# Patient Record
Sex: Male | Born: 1980 | Hispanic: No | Smoking: Never smoker
Health system: Southern US, Community
[De-identification: ages and names within clinical notes are randomized; demographics above are authoritative.]

## PROBLEM LIST (undated history)

## (undated) HISTORY — PX: HERNIA REPAIR: SHX51

---

## 2015-01-07 ENCOUNTER — Emergency Department (HOSPITAL_BASED_OUTPATIENT_CLINIC_OR_DEPARTMENT_OTHER)
Admission: EM | Admit: 2015-01-07 | Discharge: 2015-01-07 | Disposition: A | Discharge status: Home / Self Care | Payer: No Typology Code available for payment source | Attending: Emergency Medicine | Admitting: Emergency Medicine

## 2015-01-07 ENCOUNTER — Encounter (HOSPITAL_BASED_OUTPATIENT_CLINIC_OR_DEPARTMENT_OTHER): Payer: Self-pay | Admitting: Emergency Medicine

## 2015-01-07 DIAGNOSIS — Y998 Other external cause status: Secondary | ICD-10-CM | POA: Insufficient documentation

## 2015-01-07 DIAGNOSIS — Y92410 Unspecified street and highway as the place of occurrence of the external cause: Secondary | ICD-10-CM | POA: Diagnosis not present

## 2015-01-07 DIAGNOSIS — S199XXA Unspecified injury of neck, initial encounter: Secondary | ICD-10-CM | POA: Diagnosis present

## 2015-01-07 DIAGNOSIS — S161XXA Strain of muscle, fascia and tendon at neck level, initial encounter: Secondary | ICD-10-CM | POA: Insufficient documentation

## 2015-01-07 DIAGNOSIS — Y9389 Activity, other specified: Secondary | ICD-10-CM | POA: Insufficient documentation
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
## 2015-01-07 NOTE — ED Notes (Signed)
Patient preparing for discharge. 
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
## 2015-01-07 NOTE — ED Notes (Signed)
HPD at bedside 
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
## 2016-01-27 ENCOUNTER — Emergency Department (HOSPITAL_COMMUNITY): Payer: Self-pay

## 2016-01-27 ENCOUNTER — Emergency Department (HOSPITAL_COMMUNITY)
Admission: EM | Admit: 2016-01-27 | Discharge: 2016-01-27 | Disposition: A | Discharge status: Home / Self Care | Payer: Self-pay | Attending: Emergency Medicine | Admitting: Emergency Medicine

## 2016-01-27 ENCOUNTER — Encounter (HOSPITAL_COMMUNITY): Payer: Self-pay

## 2016-01-27 DIAGNOSIS — R0600 Dyspnea, unspecified: Secondary | ICD-10-CM | POA: Insufficient documentation

## 2016-01-27 DIAGNOSIS — Z79891 Long term (current) use of opiate analgesic: Secondary | ICD-10-CM | POA: Insufficient documentation

## 2016-01-27 LAB — CBC WITH DIFFERENTIAL/PLATELET
BASOS ABS: 0 10*3/uL (ref 0.0–0.1)
BASOS PCT: 0 %
EOS ABS: 0 10*3/uL (ref 0.0–0.7)
EOS PCT: 0 %
HCT: 46 % (ref 39.0–52.0)
Hemoglobin: 15.8 g/dL (ref 13.0–17.0)
Lymphocytes Relative: 13 %
Lymphs Abs: 1.6 10*3/uL (ref 0.7–4.0)
MCH: 28.9 pg (ref 26.0–34.0)
MCHC: 34.3 g/dL (ref 30.0–36.0)
MCV: 84.2 fL (ref 78.0–100.0)
MONO ABS: 0.4 10*3/uL (ref 0.1–1.0)
Monocytes Relative: 3 %
NEUTROS ABS: 10.5 10*3/uL — AB (ref 1.7–7.7)
Neutrophils Relative %: 84 %
PLATELETS: 331 10*3/uL (ref 150–400)
RBC: 5.46 MIL/uL (ref 4.22–5.81)
RDW: 13.2 % (ref 11.5–15.5)
WBC: 12.5 10*3/uL — ABNORMAL HIGH (ref 4.0–10.5)

## 2016-01-27 LAB — URINALYSIS, ROUTINE W REFLEX MICROSCOPIC
BILIRUBIN URINE: NEGATIVE
GLUCOSE, UA: NEGATIVE mg/dL
HGB URINE DIPSTICK: NEGATIVE
Ketones, ur: NEGATIVE mg/dL
Leukocytes, UA: NEGATIVE
Nitrite: NEGATIVE
PROTEIN: NEGATIVE mg/dL
Specific Gravity, Urine: 1.009 (ref 1.005–1.030)
pH: 7 (ref 5.0–8.0)

## 2016-01-27 LAB — BASIC METABOLIC PANEL
Anion gap: 7 (ref 5–15)
BUN: 15 mg/dL (ref 6–20)
CO2: 26 mmol/L (ref 22–32)
CREATININE: 1.1 mg/dL (ref 0.61–1.24)
Calcium: 9.6 mg/dL (ref 8.9–10.3)
Chloride: 105 mmol/L (ref 101–111)
GFR calc Af Amer: >60 mL/min (ref >60)
Glucose, Bld: 137 mg/dL — ABNORMAL HIGH (ref 65–99)
Potassium: 3.4 mmol/L — ABNORMAL LOW (ref 3.5–5.1)
SODIUM: 138 mmol/L (ref 135–145)

## 2016-01-27 LAB — BRAIN NATRIURETIC PEPTIDE: B NATRIURETIC PEPTIDE 5: 10.3 pg/mL (ref 0.0–100.0)

## 2016-01-27 LAB — D-DIMER, QUANTITATIVE (NOT AT ARMC)

## 2016-01-27 MED ORDER — SODIUM CHLORIDE 0.9 % IV SOLN: INTRAVENOUS | Status: DC
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
## 2016-01-27 NOTE — Progress Notes (Signed)
CM spoke [place].  CM discussed and provided written information to assist pt with determining choice for uninsured accepting pcps, discussed the importance of pcp vs EDP services for f/u care, www.needymeds.org, www.goodrx.com, discounted pharmacies and other [place] resources such as [facility] , [facility], affordable care act, financial assistance, uninsured dental services, [place] med assist, DSS and  health department  Reviewed resources for [place] uninsured accepting pcps like [facility], [facility] [address], [facility], [facility], local urgent care centers, [facility], [facility], general medical clinics, [facility], [facility] plus others, medication resources, [facility] out patient [facility]ies and housing Pt voiced understanding and appreciation of resources provided   Provided [facility] contact information Pt states he had coverage, cancelled it and has just paid the premium for a new insurance and is awaiting availability to use the new coverage
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
## 2018-01-03 ENCOUNTER — Emergency Department (HOSPITAL_COMMUNITY)
Admission: EM | Admit: 2018-01-03 | Discharge: 2018-01-03 | Disposition: A | Discharge status: Home / Self Care | Payer: Self-pay | Attending: Emergency Medicine | Admitting: Emergency Medicine

## 2018-01-03 ENCOUNTER — Other Ambulatory Visit: Payer: Self-pay

## 2018-01-03 ENCOUNTER — Encounter (HOSPITAL_COMMUNITY): Payer: Self-pay | Admitting: *Deleted

## 2018-01-03 ENCOUNTER — Emergency Department (HOSPITAL_COMMUNITY): Payer: Self-pay

## 2018-01-03 DIAGNOSIS — R0602 Shortness of breath: Secondary | ICD-10-CM | POA: Insufficient documentation

## 2018-01-03 LAB — CBC WITH DIFFERENTIAL/PLATELET
BASOS PCT: 0 %
Basophils Absolute: 0 10*3/uL (ref 0.0–0.1)
EOS ABS: 0 10*3/uL (ref 0.0–0.7)
EOS PCT: 0 %
HEMATOCRIT: 45.3 % (ref 39.0–52.0)
Hemoglobin: 15.7 g/dL (ref 13.0–17.0)
Lymphocytes Relative: 21 %
Lymphs Abs: 2.7 10*3/uL (ref 0.7–4.0)
MCH: 29.3 pg (ref 26.0–34.0)
MCHC: 34.7 g/dL (ref 30.0–36.0)
MCV: 84.5 fL (ref 78.0–100.0)
MONO ABS: 0.3 10*3/uL (ref 0.1–1.0)
MONOS PCT: 2 %
Neutro Abs: 9.8 10*3/uL — ABNORMAL HIGH (ref 1.7–7.7)
Neutrophils Relative %: 77 %
Platelets: 312 10*3/uL (ref 150–400)
RBC: 5.36 MIL/uL (ref 4.22–5.81)
RDW: 13.5 % (ref 11.5–15.5)
WBC: 12.8 10*3/uL — ABNORMAL HIGH (ref 4.0–10.5)

## 2018-01-03 LAB — BASIC METABOLIC PANEL
Anion gap: 10 (ref 5–15)
BUN: 17 mg/dL (ref 6–20)
CALCIUM: 10.2 mg/dL (ref 8.9–10.3)
CO2: 26 mmol/L (ref 22–32)
CREATININE: 1.1 mg/dL (ref 0.61–1.24)
Chloride: 104 mmol/L (ref 98–111)
GFR calc Af Amer: >60 mL/min (ref >60)
GFR calc non Af Amer: >60 mL/min (ref >60)
GLUCOSE: 121 mg/dL — AB (ref 70–99)
Potassium: 3.8 mmol/L (ref 3.5–5.1)
Sodium: 140 mmol/L (ref 135–145)

## 2018-01-03 LAB — I-STAT TROPONIN, ED: Troponin i, poc: 0 ng/mL (ref 0.00–0.08)

## 2018-01-03 MED ORDER — LORAZEPAM 1 MG PO TABS
1.0000 mg | ORAL_TABLET | Freq: Once | ORAL | Status: AC
  Administered 2018-01-03: 1 mg via ORAL
  Filled 2018-01-03: qty 1

## 2018-01-03 MED ORDER — HYDROXYZINE HCL 25 MG PO TABS: 25.0000 mg | ORAL_TABLET | Freq: Four times a day (QID) | ORAL | 0 refills | Status: DC

## 2018-01-03 NOTE — ED Notes (Signed)
Ending O2 sat was 95% upon discharge.

## 2018-01-03 NOTE — ED Provider Notes (Signed)
[facility]-EMERGENCY DEPT Provider Note   CSN: [id number] Arrival date & time: 01/03/18  1608     History   Chief Complaint Chief Complaint  Patient presents with  . Shortness of Breath    HPI Clifford Bradford is a 37 y.o. male with no significant past medical history presents emergency department today for shortness of breath.  Patient reports that today he was outside in the heat for some time.  When he went to drive home he felt a sudden onset of shortness of breath, palpitations, dizziness, and numbness of his hands.  Patient states that he felt very anxious and also like he was going to pass out.  He states the symptoms lasted for approximately 25 minutes before resolving.  They have not recurred.  He thinks this may be due to an anxiety attack.  He reports the same thing happened to him last year.  On chart review patient with similar complaints last year with reassuring work-up including EKG, troponin, basic blood work and d-dimer.  He notes he has not followed up for this.  He notes he has been more stressed here recently.  His symptoms are not accompanied with headache, visual changes, facial droop, difficulty with speech, changes in hearing, tinnitus, vertigo, syncope, focal weakness, chest pain, cough, abdominal pain, nausea/vomiting/diarrhea, urinary symptoms.  Patient reports his symptoms are nonexertional, non-positional in nature.  He denies any pleuritic chest pain. Denies risk factors for PE including exogenous estrogen/testosterone use, recent surgery or travel, trauma, immobilization, smoking, previous blood clot, cough, hemoptysis, cancer, lower extremity pain or swelling, or family history of bleeding/clotting disorder.  Patient is a never smoker.  He denies history of MI, CVA, TIA, diabetes, hypertension, hyperlipidemia.  He denies any alcohol or drug use.  No new medications or over-the-counter medication use.  He denies any melena or hematochezia.    HPI  History reviewed. No pertinent past medical history.  There are no active problems to display for this patient.   History reviewed. No pertinent surgical history.      Home Medications    Prior to Admission medications   Medication Sig Start Date End Date Taking? Authorizing Provider  Acetaminophen (PAIN RELIEF 8 HOUR PO) Take 2 tablets by mouth every 6 (six) hours as needed (pain).    [provider]    Family History No family history on file.  Social History Social History   Tobacco Use  . Smoking status: Never Smoker  . Smokeless tobacco: Never Used  Substance Use Topics  . Alcohol use: No  . Drug use: No     Allergies   Soy allergy   Review of Systems Review of Systems  All other systems reviewed and are negative.    Physical Exam Updated Vital Signs BP (!) 155/96 (BP Location: Left Arm)   Pulse 79   Temp 98.4 F (36.9 C) (Oral)   Resp 18   SpO2 100%   Physical Exam  Constitutional: He appears well-developed and well-nourished.  HENT:  Head: Normocephalic and atraumatic.  Right Ear: External ear normal.  Left Ear: External ear normal.  Nose: Nose normal.  Mouth/Throat: Uvula is midline, oropharynx is clear and moist and mucous membranes are normal. No tonsillar exudate.  Eyes: Pupils are equal, round, and reactive to light. Right eye exhibits no discharge. Left eye exhibits no discharge. No scleral icterus.  Neck: Trachea normal. Neck supple. No spinous process tenderness present. No neck rigidity. Normal range of motion present.  Cardiovascular:  Normal rate, regular rhythm and intact distal pulses.  No murmur heard. Pulses:      Radial pulses are 2+ on the right side, and 2+ on the left side.       Dorsalis pedis pulses are 2+ on the right side, and 2+ on the left side.       Posterior tibial pulses are 2+ on the right side, and 2+ on the left side.  No lower extremity swelling or edema. Calves symmetric in size bilaterally.   Pulmonary/Chest: Effort normal and breath sounds normal. He exhibits no tenderness.  Patient satting at 100% on room air. No increased work of breathing. No accessory muscle use. Patient is sitting upright, speaking in full sentences without difficulty   Abdominal: Soft. Bowel sounds are normal. There is no tenderness. There is no rebound and no guarding.  Musculoskeletal: He exhibits no edema.  Lymphadenopathy:    He has no cervical adenopathy.  Neurological: He is alert.  Mental Status:  Alert, oriented, thought content appropriate, able to give a coherent history. Speech fluent without evidence of aphasia. Able to follow 2 step commands without difficulty.  Cranial Nerves:  II:  Peripheral visual fields grossly normal, pupils equal, round, reactive to light III,IV, VI: ptosis not present, extra-ocular motions intact bilaterally  V,VII: smile symmetric, eyebrows raise symmetric, facial light touch sensation equal VIII: hearing grossly normal to voice  X: uvula elevates symmetrically  XI: bilateral shoulder shrug symmetric and strong XII: midline tongue extension without fassiculations Motor:  Normal tone. 5/5 in upper and lower extremities bilaterally including strong and equal grip strength and dorsiflexion/plantar flexion Sensory: Sensation intact to light touch in all extremities. Negative Romberg.  Deep Tendon Reflexes: 2+ and symmetric in the biceps and patella Cerebellar: normal finger-to-nose with bilateral upper extremities. Normal heel-to -shin balance bilaterally of the lower extremity. No pronator drift.  Gait: normal gait and balance CV: distal pulses palpable throughout   Skin: Skin is warm and dry. No rash noted. He is not diaphoretic.  Psychiatric: He has a normal mood and affect.  Nursing note and vitals reviewed.    ED Treatments / Results  Labs (all labs ordered are listed, but only abnormal results are displayed) Labs Reviewed  BASIC METABOLIC PANEL -  Abnormal; Notable for the following components:      Result Value   Glucose, Bld 121 (*)    All other components within normal limits  CBC WITH DIFFERENTIAL/PLATELET - Abnormal; Notable for the following components:   WBC 12.8 (*)    Neutro Abs 9.8 (*)    All other components within normal limits  I-STAT TROPONIN, ED    EKG EKG Interpretation  Date/Time:  Tuesday January 03 2018 17:30:55 EDT Ventricular Rate:  76 PR Interval:    QRS Duration: 101 QT Interval:  361 QTC Calculation: 406 R Axis:   24 Text Interpretation:  Sinus rhythm ST elev, probable normal early repol pattern similar to prior 8/17 Confirmed by Meridee Score [phone]) on 01/03/2018 5:34:55 PM   Radiology Dg Chest 2 View  Result Date: 01/03/2018 CLINICAL DATA:  Pt states he feels anxious, shortness of breath, and bilateral hand tingling. Pt denies chest pain. No diabetes, family hx HTN EXAM: CHEST - 2 VIEW COMPARISON:  01/27/2016 FINDINGS: Mild enlargement of the cardiopericardial silhouette without edema. Low lung volumes are present, causing crowding of the pulmonary vasculature. The lungs appear clear. No pleural effusion. Mild lower thoracic spondylosis. IMPRESSION: 1. Mild enlargement of the cardiopericardial silhouette, without  edema. 2. Low lung volumes. Electronically Signed   By: Gaylyn RongWalter  Liebkemann M.D.   On: 01/03/2018 19:59    Procedures Procedures (including critical care time)  Medications Ordered in ED Medications  LORazepam (ATIVAN) tablet 1 mg (1 mg Oral Given 01/03/18 1901)     Initial Impression / Assessment and Plan / ED Course  I have reviewed the triage vital signs and the nursing notes.  Pertinent labs & imaging results that were available during my care of the patient were reviewed by me and considered in my medical decision making (see chart for details).     37 y.o. male with no significant past medical history presents for episode of shortness of breath, palpitations, dizziness,  numbness of his hands and anxiety that lasts for approximately 25 minutes earlier today.  He denies any associated focal neurologic symptoms or chest pain.  He had similar symptoms last year and had a reassuring work-up.  He has not followed up with PCP for this.  No new medications.  No melena or hematochezia.  Denies syncope.  Exam is reassuring as above.  No exophthalmos.  No diaphoresis.  Heart is regular rate and rhythm.  Lungs are clear to auscultation bilaterally.  Abdomen soft and nontender.  He has normal neurologic exam.  Vital signs are reassuring without fever, tachycardia, tachypnea, hypoxia or hypotension.  Lab work, EKG and chest x-ray ordered.  Patient is without tachycardia, hypoxia.  Patient is Wells and Perc negative.  Do not suspect PE.  There is no significant change of patient's EKG and troponin is negative.  Low risk per heart score.  I have low suspicion for ACS at this time.  Patient is without anemia.  His electrolytes are within normal limits.  There is no evidence of DKA.  No acute kidney injury.  Chest x-ray does show mild enlargement of his cardiopericardial silhouette but without edema.  Discussed this with the patient he is to follow-up with his PCP.  Patient has been without tachycardia or hypoxia in the department. He reports his symptoms have not reoccurred. He reports improvement of symptoms with above tx. The evaluation does not show pathology that would require ongoing emergent intervention or inpatient treatment. I advised the patient to follow-up with PCP this week. I advised the patient to return to the emergency department with new or worsening symptoms or new concerns. Specific return precautions discussed. The patient verbalized understanding and agreement with plan. All questions answered. No further questions at this time. The patient is hemodynamically stable, mentating appropriately and appears safe for discharge.  Final Clinical Impressions(s) / ED Diagnoses    Final diagnoses:  Shortness of breath    ED Discharge Orders        Ordered    hydrOXYzine (ATARAX/VISTARIL) 25 MG tablet  Every 6 hours     01/03/18 2133       Princella PellegriniMaczis, Caelan Atchley M, PA-C 01/06/18 0916    Terrilee FilesButler, Axton Cihlar C, MD 01/06/18 [phone]

## 2018-01-03 NOTE — ED Triage Notes (Signed)
Pt states he feels anxious, shortness of breath, and bilateral hand tingling. Pt denies chest pain.

## 2018-01-03 NOTE — Discharge Instructions (Addendum)
Please read and follow all provided instructions.  Your diagnoses today include:  1. Shortness of breath     Tests performed today include: 1. Vital signs. See below for your results today.  2. Chest xray 3. Troponin -there is an enzyme that is released from her heart during times of stress.  This was normal. 4. EKG -this did not show any new changes 5. Blood levels and electrolytes -this did not show any concerning changes  Home care instructions:  Follow any educational materials contained in this packet.  Follow-up instructions: Please follow-up with your primary care provider this week for further evaluation of symptoms and treatment   Return instructions:   Please return to the Emergency Department if you do not get better, if you get worse, or new symptoms OR  Get help right away if:  Your shortness of breath gets worse.  You cough up blood  You have shortness of breath when you are resting.  You feel light-headed or you faint.  You cough up blood.  You have pain with breathing.  You have pain in your chest, arms, shoulders, or abdomen.  You have a fever.   You cannot walk up stairs or exercise the way that you normally do.   Change in mental status (confusion or lethargy)   New numbness or weakness     Please return if you have any other emergent concerns.  Additional Information:  Your vital signs today were: BP (!) 152/75    Pulse 71    Temp 98.4 F (36.9 C) (Oral)    Resp 18    SpO2 100%  If your blood pressure (BP) was elevated above 135/85 this visit, please have this repeated by your doctor within one month.

## 2018-04-18 ENCOUNTER — Emergency Department (HOSPITAL_COMMUNITY): Payer: Self-pay

## 2018-04-18 ENCOUNTER — Encounter (HOSPITAL_COMMUNITY): Payer: Self-pay

## 2018-04-18 ENCOUNTER — Emergency Department (HOSPITAL_COMMUNITY)
Admission: EM | Admit: 2018-04-18 | Discharge: 2018-04-19 | Disposition: A | Discharge status: Home / Self Care | Payer: Self-pay | Attending: Emergency Medicine | Admitting: Emergency Medicine

## 2018-04-18 DIAGNOSIS — E876 Hypokalemia: Secondary | ICD-10-CM | POA: Insufficient documentation

## 2018-04-18 DIAGNOSIS — H538 Other visual disturbances: Secondary | ICD-10-CM | POA: Insufficient documentation

## 2018-04-18 DIAGNOSIS — Z733 Stress, not elsewhere classified: Secondary | ICD-10-CM | POA: Insufficient documentation

## 2018-04-18 DIAGNOSIS — F419 Anxiety disorder, unspecified: Secondary | ICD-10-CM | POA: Insufficient documentation

## 2018-04-18 DIAGNOSIS — R202 Paresthesia of skin: Secondary | ICD-10-CM | POA: Insufficient documentation

## 2018-04-18 LAB — CBC
HEMATOCRIT: 44.9 % (ref 39.0–52.0)
HEMOGLOBIN: 14.9 g/dL (ref 13.0–17.0)
MCH: 28.4 pg (ref 26.0–34.0)
MCHC: 33.2 g/dL (ref 30.0–36.0)
MCV: 85.5 fL (ref 80.0–100.0)
Platelets: 321 10*3/uL (ref 150–400)
RBC: 5.25 MIL/uL (ref 4.22–5.81)
RDW: 13.2 % (ref 11.5–15.5)
WBC: 9.5 10*3/uL (ref 4.0–10.5)
nRBC: 0 % (ref 0.0–0.2)

## 2018-04-18 LAB — BASIC METABOLIC PANEL
ANION GAP: 9 (ref 5–15)
BUN: 17 mg/dL (ref 6–20)
CHLORIDE: 105 mmol/L (ref 98–111)
CO2: 26 mmol/L (ref 22–32)
CREATININE: 1.27 mg/dL — AB (ref 0.61–1.24)
Calcium: 10.1 mg/dL (ref 8.9–10.3)
GFR calc non Af Amer: >60 mL/min (ref >60)
Glucose, Bld: 106 mg/dL — ABNORMAL HIGH (ref 70–99)
Potassium: 3.1 mmol/L — ABNORMAL LOW (ref 3.5–5.1)
Sodium: 140 mmol/L (ref 135–145)

## 2018-04-18 MED ORDER — LORAZEPAM 2 MG/ML IJ SOLN
1.0000 mg | Freq: Once | INTRAMUSCULAR | Status: AC
  Administered 2018-04-18: 1 mg via INTRAVENOUS
  Filled 2018-04-18: qty 1

## 2018-04-18 MED ORDER — GADOBUTROL 1 MMOL/ML IV SOLN
10.0000 mL | Freq: Once | INTRAVENOUS | Status: AC | PRN
  Administered 2018-04-18: 10 mL via INTRAVENOUS

## 2018-04-18 MED ORDER — LORAZEPAM 2 MG/ML IJ SOLN
2.0000 mg | Freq: Once | INTRAMUSCULAR | Status: AC
  Administered 2018-04-18: 2 mg via INTRAVENOUS
  Filled 2018-04-18: qty 1

## 2018-04-18 NOTE — ED Provider Notes (Signed)
Clifford [facility] Provider Note   CSN: [id number] Arrival date & time: 04/18/18  1532     History   Chief Complaint Chief Complaint  Patient presents with  . Numbness    left side    HPI Clifford Bradford is a 37 y.o. male here presenting with numbness, weakness.  Patient states that he has occasional left-sided numbness for the last 2 years.  Patient states that the numbness comes on when he is under a lot of stress.  He states that today he started having some abdominal pain and then he proceeded having some left-sided numbness in his face as well as left side of his neck and arm and leg.  He states that his vision has been more blurry but denies double vision. Denies any headaches.  Patient has no previous diagnosis of multiple sclerosis or strokes in the past.  Patient states that the abdominal pain has resolved since he is in the ER and he has no vomiting or fevers.  States that he is under a lot of stress recently.  The history is provided by the patient.    History reviewed. No pertinent past medical history.  There are no active problems to display for this patient.   History reviewed. No pertinent surgical history.      Home Medications    Prior to Admission medications   Medication Sig Start Date End Date Taking? Authorizing Provider  acetaminophen (TYLENOL) 500 MG tablet Take 500 mg by mouth every 6 (six) hours as needed for moderate pain.   Yes [provider]  hydrOXYzine (ATARAX/VISTARIL) 25 MG tablet Take 1 tablet (25 mg total) by mouth every 6 (six) hours. Patient not taking: Reported on 04/18/2018 01/03/18   Maczis, Elmer Sow, PA-C    Family History History reviewed. No pertinent family history.  Social History Social History   Tobacco Use  . Smoking status: Never Smoker  . Smokeless tobacco: Never Used  Substance Use Topics  . Alcohol use: No  . Drug use: No     Allergies   Soy allergy   Review of  Systems Review of Systems  Neurological: Positive for numbness and headaches.  All other systems reviewed and are negative.    Physical Exam Updated Vital Signs BP (!) 154/87   Pulse 79   Temp 98.7 F (37.1 C) (Oral)   Resp 16   Ht 6\' 1"  (1.854 m)   Wt 108.9 kg   SpO2 100%   BMI 31.66 kg/m   Physical Exam  Constitutional: He is oriented to person, place, and time. He appears well-developed and well-nourished.  Slightly anxious   HENT:  Head: Normocephalic.  Mouth/Throat: Oropharynx is clear and moist.  Eyes: Pupils are equal, round, and reactive to light. Conjunctivae and EOM are normal.  No diplopia   Neck: Normal range of motion. Neck supple.  Cardiovascular: Normal rate, regular rhythm and normal heart sounds.  Pulmonary/Chest: Effort normal and breath sounds normal. No stridor. No respiratory distress.  Abdominal: Soft. Bowel sounds are normal. He exhibits no distension. There is no tenderness.  Musculoskeletal: Normal range of motion.  Neurological: He is alert and oriented to person, place, and time.  CN 2- 12 intact. Slightly dec sensation L arm and L leg, nl strength throughout. Nl gait. Nl finger to nose bilaterally.   Skin: Skin is warm. Capillary refill takes less than 2 seconds.  Psychiatric: He has a normal mood and affect.  Nursing note and vitals reviewed.  ED Treatments / Results  Labs (all labs ordered are listed, but only abnormal results are displayed) Labs Reviewed  BASIC METABOLIC PANEL - Abnormal; Notable for the following components:      Result Value   Potassium 3.1 (*)    Glucose, Bld 106 (*)    Creatinine, Ser 1.27 (*)    All other components within normal limits  CBC  URINALYSIS, ROUTINE W REFLEX MICROSCOPIC  CBG MONITORING, ED    EKG EKG Interpretation  Date/Time:  Tuesday April 18 2018 17:03:45 EDT Ventricular Rate:  74 PR Interval:    QRS Duration: 97 QT Interval:  358 QTC Calculation: 398 R Axis:   30 Text  Interpretation:  Sinus rhythm RSR' in V1 or V2, right VCD or RVH No significant change since last tracing Confirmed by Richardean Canal [phone]) on 04/18/2018 9:32:47 PM   Radiology No results found.  Procedures Procedures (including critical care time)  Medications Ordered in ED Medications  LORazepam (ATIVAN) injection 1 mg (1 mg Intravenous Given 04/18/18 2156)  LORazepam (ATIVAN) injection 2 mg (2 mg Intravenous Given 04/18/18 2250)     Initial Impression / Assessment and Plan / ED Course  I have reviewed the triage vital signs and the nursing notes.  Pertinent labs & imaging results that were available during my care of the patient were reviewed by me and considered in my medical decision making (see chart for details).    Clifford Bradford is a 36 y.o. male here with occasional blurry vision, L arm and leg numbness. He is under a lot of stress. Slightly dec sensation L arm and leg. Will get MRI brain/cervical spine to r/o MS. Will get basic labs.   11:45 PM MRI brain/cervical spine pending. Signed out to Dr. Denton Lank to follow up results. If MRI normal, will dc home.   Final Clinical Impressions(s) / ED Diagnoses   Final diagnoses:  None    ED Discharge Orders    None       Charlynne Pander, MD 04/18/18 [phone]

## 2018-04-18 NOTE — ED Triage Notes (Signed)
Patient arrived via POV. -C/o left side numbness starting from top of head, c/o of fingers tingling that comes in waves. Back of neck is tight.  Numbness started today about 1330.  Tingles and tightness on and off x2years

## 2018-04-19 MED ORDER — POTASSIUM CHLORIDE CRYS ER 20 MEQ PO TBCR: 20.0000 meq | EXTENDED_RELEASE_TABLET | Freq: Every day | ORAL | 0 refills | Status: DC

## 2018-04-19 MED ORDER — POTASSIUM CHLORIDE CRYS ER 20 MEQ PO TBCR
40.0000 meq | EXTENDED_RELEASE_TABLET | Freq: Once | ORAL | Status: AC
  Administered 2018-04-19: 40 meq via ORAL
  Filled 2018-04-19: qty 2

## 2018-04-19 NOTE — ED Provider Notes (Signed)
Signed out by Dr Silverio Lay at 12 mn that if/when mri neg for acute process to d/c to home.  Mri neg acute.  Labs reviewed - k sl low, kcl po.  Pt informed of mr result.   Pt appears stable for d/c.      Cathren Laine, MD 04/19/18 [phone]

## 2018-04-19 NOTE — Discharge Instructions (Addendum)
It was our pleasure to provide your ER care today - we hope that you feel better.  Follow up with primary care doctor/neurologist in the next 1-2 weeks for recheck - call office to arrange appointment.  From today's lab tests, your potassium level is mildly low (3.1) - eat plenty of fruits and vegetables, take potassium supplement as prescribed, and follow up with primary care doctor.

## 2019-08-30 ENCOUNTER — Other Ambulatory Visit: Payer: Self-pay

## 2019-08-30 ENCOUNTER — Ambulatory Visit (INDEPENDENT_AMBULATORY_CARE_PROVIDER_SITE_OTHER): Payer: No Typology Code available for payment source | Admitting: Family Medicine

## 2019-08-30 ENCOUNTER — Encounter: Payer: Self-pay | Admitting: Family Medicine

## 2019-08-30 VITALS — BP 138/76 | HR 71 | Temp 98.0°F | Ht 73.0 in | Wt 239.6 lb

## 2019-08-30 DIAGNOSIS — Z13228 Encounter for screening for other metabolic disorders: Secondary | ICD-10-CM | POA: Diagnosis not present

## 2019-08-30 DIAGNOSIS — Z130 Encounter for screening for diseases of the blood and blood-forming organs and certain disorders involving the immune mechanism: Secondary | ICD-10-CM

## 2019-08-30 DIAGNOSIS — Z13220 Encounter for screening for lipoid disorders: Secondary | ICD-10-CM | POA: Diagnosis not present

## 2019-08-30 DIAGNOSIS — Z0000 Encounter for general adult medical examination without abnormal findings: Secondary | ICD-10-CM | POA: Diagnosis not present

## 2019-08-30 DIAGNOSIS — Z1329 Encounter for screening for other suspected endocrine disorder: Secondary | ICD-10-CM | POA: Diagnosis not present

## 2019-08-30 NOTE — Patient Instructions (Addendum)
Use flonase (fluticasone) or nasacort (triamcinolone) nasal sprays 1-2 sprays every night for your nasal drainage  Preventive Care 69-39 Years Old, Male Preventive care refers to lifestyle choices and visits with your health care provider that can promote health and wellness. This includes:  A yearly physical exam. This is also called an annual well check.  Regular dental and eye exams.  Immunizations.  Screening for certain conditions.  Healthy lifestyle choices, such as eating a healthy diet, getting regular exercise, not using drugs or products that contain nicotine and tobacco, and limiting alcohol use. What can I expect for my preventive care visit? Physical exam Your health care provider will check:  Height and weight. These may be used to calculate body mass index (BMI), which is a measurement that tells if you are at a healthy weight.  Heart rate and blood pressure.  Your skin for abnormal spots. Counseling Your health care provider may ask you questions about:  Alcohol, tobacco, and drug use.  Emotional well-being.  Home and relationship well-being.  Sexual activity.  Eating habits.  Work and work Statistician. What immunizations do I need?  Influenza (flu) vaccine  This is recommended every year. Tetanus, diphtheria, and pertussis (Tdap) vaccine  You may need a Td booster every 10 years. Varicella (chickenpox) vaccine  You may need this vaccine if you have not already been vaccinated. Human papillomavirus (HPV) vaccine  If recommended by your health care provider, you may need three doses over 6 months. Measles, mumps, and rubella (MMR) vaccine  You may need at least one dose of MMR. You may also need a second dose. Meningococcal conjugate (MenACWY) vaccine  One dose is recommended if you are 50-68 years old and a Market researcher living in a residence hall, or if you have one of several medical conditions. You may also need additional  booster doses. Pneumococcal conjugate (PCV13) vaccine  You may need this if you have certain conditions and were not previously vaccinated. Pneumococcal polysaccharide (PPSV23) vaccine  You may need one or two doses if you smoke cigarettes or if you have certain conditions. Hepatitis A vaccine  You may need this if you have certain conditions or if you travel or work in places where you may be exposed to hepatitis A. Hepatitis B vaccine  You may need this if you have certain conditions or if you travel or work in places where you may be exposed to hepatitis B. Haemophilus influenzae type b (Hib) vaccine  You may need this if you have certain risk factors. You may receive vaccines as individual doses or as more than one vaccine together in one shot (combination vaccines). Talk with your health care provider about the risks and benefits of combination vaccines. What tests do I need? Blood tests  Lipid and cholesterol levels. These may be checked every 5 years starting at age 78.  Hepatitis C test.  Hepatitis B test. Screening   Diabetes screening. This is done by checking your blood sugar (glucose) after you have not eaten for a while (fasting).  Sexually transmitted disease (STD) testing. Talk with your health care provider about your test results, treatment options, and if necessary, the need for more tests. Follow these instructions at home: Eating and drinking   Eat a diet that includes fresh fruits and vegetables, whole grains, lean protein, and low-fat dairy products.  Take vitamin and mineral supplements as recommended by your health care provider.  Do not drink alcohol if your health care provider tells  you not to drink.  If you drink alcohol: ? Limit how much you have to 0-2 drinks a day. ? Be aware of how much alcohol is in your drink. In the [place] one drink equals one 12 oz bottle of beer (355 mL), one 5 oz glass of wine (148 mL), or one 1 oz glass of hard liquor  (44 mL). Lifestyle  Take daily care of your teeth and gums.  Stay active. Exercise for at least 30 minutes on 5 or more days each week.  Do not use any products that contain nicotine or tobacco, such as cigarettes, e-cigarettes, and chewing tobacco. If you need help quitting, ask your health care provider.  If you are sexually active, practice safe sex. Use a condom or other form of protection to prevent STIs (sexually transmitted infections). What's next?  Go to your health care provider once a year for a well check visit.  Ask your health care provider how often you should have your eyes and teeth checked.  Stay up to date on all vaccines. This information is not intended to replace advice given to you by your health care provider. Make sure you discuss any questions you have with your health care provider. Document Revised: 06/01/2018 Document Reviewed: 06/01/2018 Elsevier Patient Education  [employer].    If you have lab work done today you will be contacted with your lab results within the next 2 weeks.  If you have not heard from [place] then please contact us. The fastest way to get your results is to register for My Chart.   IF you received an x-ray today, you will receive an invoice from [facility]. Please contact [facility] at [phone] with questions or concerns regarding your invoice.   IF you received labwork today, you will receive an invoice from [facility]. Please contact [facility] at [phone] with questions or concerns regarding your invoice.   Our billing staff will not be able to assist you with questions regarding bills from these companies.  You will be contacted with the lab results as soon as they are available. The fastest way to get your results is to activate your My Chart account. Instructions are located on the last page of this paperwork. If you have not heard from [place] regarding the results in 2 weeks, please contact this  office.

## 2019-08-30 NOTE — Progress Notes (Signed)
3/11/20218:23 AM  Nicholas Lose March 27, 1981, 39 y.o., male [id number]  Chief Complaint  Patient presents with  . Annual Exam    has not had physical in 10 yrs    HPI:   Patient is a 39 y.o. 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 0  Number falls in past yr: 0  Injury with Fall? 0     Allergies  Allergen Reactions  . Soy Allergy Shortness Of Breath and Swelling    Prior to Admission medications   Not on File    History reviewed. No pertinent past medical history.  History reviewed. No pertinent surgical history.  Social History   Tobacco Use  . Smoking status: Never Smoker  . Smokeless tobacco: Never Used  Substance Use Topics  . Alcohol use: No    Family History  Problem Relation Age of Onset  . Healthy Mother   . Healthy Father   . Healthy Sister     Review of Systems  Constitutional: Negative for chills, diaphoresis, fever and malaise/fatigue.  HENT: Positive for congestion and hearing loss (muffled, poping). Negative for tinnitus.   Eyes: Negative for blurred vision and double vision.    Respiratory: Positive for shortness of breath (when anxious). Negative for cough.   Cardiovascular: Negative for chest pain, palpitations and leg swelling.  Gastrointestinal: Positive for heartburn. Negative for abdominal pain, nausea and vomiting.  Psychiatric/Behavioral: Negative for depression. The patient is nervous/anxious. The patient does not have insomnia.   All other systems reviewed and are negative.    OBJECTIVE:  Today's Vitals   08/30/19 0816  BP: 138/76  Pulse: 71  Temp: 98 F (36.7 C)  SpO2: 100%  Weight: 239 lb 9.6 oz (108.7 kg)  Height: 6\' 1"  (1.854 m)   Body mass index is 31.61 kg/m.   Physical Exam Vitals and nursing note reviewed.  Constitutional:      Appearance: He is well-developed.  HENT:     Head: Normocephalic and atraumatic.     Right Ear: Hearing, tympanic membrane, ear canal and external ear normal.     Left Ear: Hearing, tympanic membrane, ear canal and external ear normal.     Mouth/Throat:     Pharynx: No oropharyngeal exudate.  Eyes:     Conjunctiva/sclera: Conjunctivae normal.     Pupils: Pupils are equal, round, and reactive to light.  Neck:     Thyroid: No thyromegaly.  Cardiovascular:     Rate and Rhythm: Normal rate and regular rhythm.  Heart sounds: Normal heart sounds. No murmur. No friction rub. No gallop.   Pulmonary:     Effort: Pulmonary effort is normal.     Breath sounds: Normal breath sounds. No wheezing, rhonchi or rales.  Abdominal:     General: Bowel sounds are normal. There is no distension.     Palpations: Abdomen is soft. There is no mass.     Tenderness: There is no abdominal tenderness.  Musculoskeletal:        General: Normal range of motion.     Cervical back: Neck supple.     Right lower leg: No edema.     Left lower leg: No edema.  Lymphadenopathy:     Cervical: No cervical adenopathy.  Skin:    General: Skin is warm and dry.  Neurological:     Mental Status: He is alert and oriented to person,  place, and time.     Cranial Nerves: No cranial nerve deficit.     Coordination: Coordination normal.     Gait: Gait normal.     Deep Tendon Reflexes: Reflexes are normal and symmetric.     No results found for this or any previous visit (from the past 24 hour(s)).  No results found.   ASSESSMENT and PLAN  1. Annual physical exam Routine HCM labs ordered. HCM reviewed/discussed. Anticipatory guidance regarding healthy weight, lifestyle and choices given.   2. Screening for lipid disorders - Lipid panel  3. Screening for endocrine, metabolic and immunity disorder - Comprehensive metabolic panel  Return in about 1 year (around 08/29/2020).    Myles Lipps, MD [facility] at [facility] [address] [place], [place] [address] Ph.  [phone] Fax [phone]

## 2019-08-31 LAB — COMPREHENSIVE METABOLIC PANEL
ALT: 38 IU/L (ref 0–44)
AST: 29 IU/L (ref 0–40)
Albumin/Globulin Ratio: 1.7 (ref 1.2–2.2)
Albumin: 4.8 g/dL (ref 4.0–5.0)
Alkaline Phosphatase: 58 IU/L (ref 39–117)
BUN/Creatinine Ratio: 12 (ref 9–20)
BUN: 12 mg/dL (ref 6–20)
Bilirubin Total: 0.3 mg/dL (ref 0.0–1.2)
CO2: 25 mmol/L (ref 20–29)
Calcium: 10.1 mg/dL (ref 8.7–10.2)
Chloride: 101 mmol/L (ref 96–106)
Creatinine, Ser: 1.04 mg/dL (ref 0.76–1.27)
GFR calc Af Amer: 105 mL/min/{1.73_m2} (ref >59)
GFR calc non Af Amer: 91 mL/min/{1.73_m2} (ref >59)
Globulin, Total: 2.8 g/dL (ref 1.5–4.5)
Glucose: 97 mg/dL (ref 65–99)
Potassium: 4.4 mmol/L (ref 3.5–5.2)
Sodium: 142 mmol/L (ref 134–144)
Total Protein: 7.6 g/dL (ref 6.0–8.5)

## 2019-08-31 LAB — LIPID PANEL
Chol/HDL Ratio: 3.8 ratio (ref 0.0–5.0)
Cholesterol, Total: 237 mg/dL — ABNORMAL HIGH (ref 100–199)
HDL: 63 mg/dL (ref >39)
LDL Chol Calc (NIH): 147 mg/dL — ABNORMAL HIGH (ref 0–99)
Triglycerides: 150 mg/dL — ABNORMAL HIGH (ref 0–149)
VLDL Cholesterol Cal: 27 mg/dL (ref 5–40)

## 2020-02-19 IMAGING — MR MR CERVICAL SPINE WO/W CM
16 of 23 series · 25 of 48 positions shown · IV contrast (gadavist)
Comparison: None.

CLINICAL DATA: Initial evaluation for left-sided numbness with
tingling.

EXAM:
MRI HEAD WITHOUT AND WITH CONTRAST
MRI CERVICAL SPINE WITHOUT AND WITH CONTRAST
TECHNIQUE: Multiplanar, multiecho pulse sequences of the brain and surrounding
structures, and cervical spine, to include the craniocervical
junction and cervicothoracic junction, were obtained without and
with intravenous contrast.
CONTRAST:  10 cc of Gadavist.

[Series 4: DWI · axial · 3.0mm · 1.09mm/px · z∈[-58,+95]mm · 6 of 110 slices shown (1 of 2)]
[im 1/110]
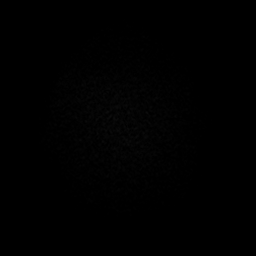
[im 22/110]
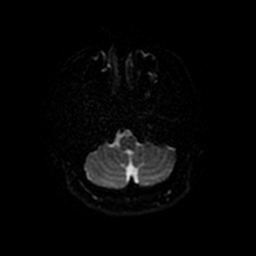
[im 44/110]
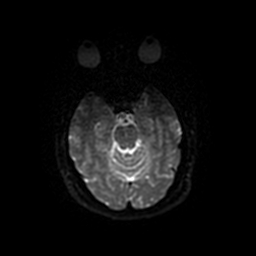
[im 66/110]
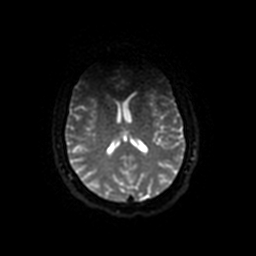
[im 88/110]
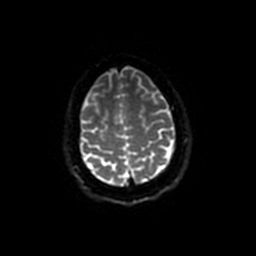
[im 110/110]
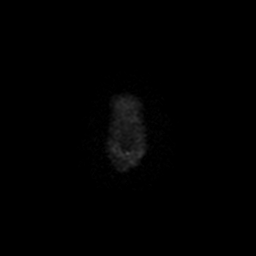

[Series 5: T1 · sagittal · 5.0mm · 0.47mm/px · 2 of 24 slices shown (1 of 3)]
[im 1/24]
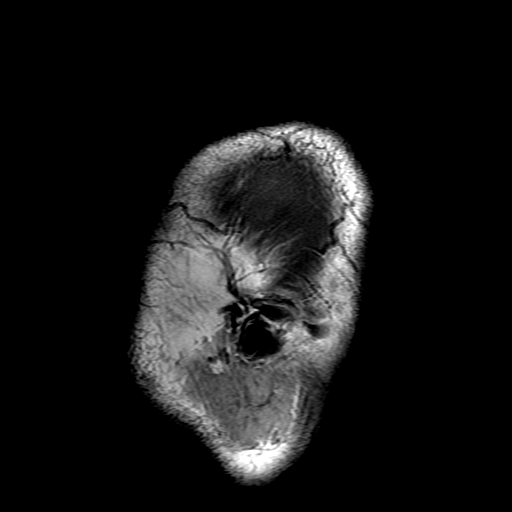
[im 24/24]
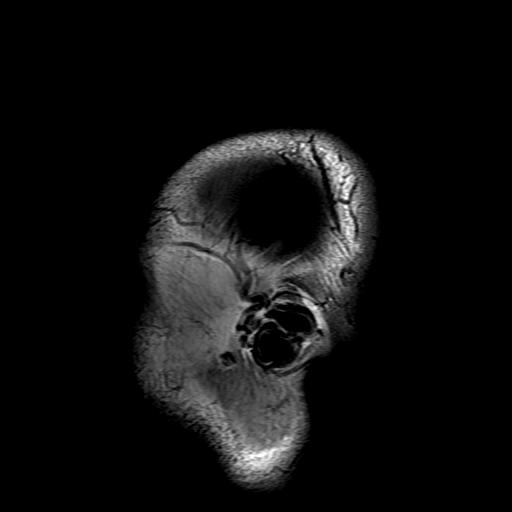

[Series 6: DWI · coronal · 4.0mm · 1.09mm/px · 3 of 70 slices shown (2 of 2)]
[im 1/70]
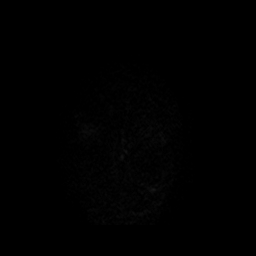
[im 35/70]
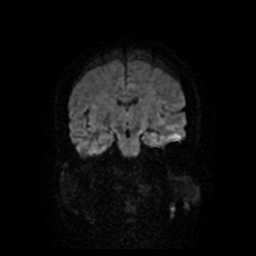
[im 70/70]
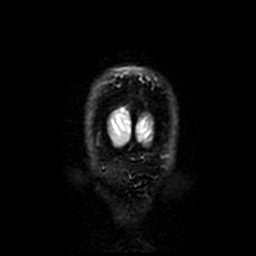

[Series 7: T2 · axial · 5.0mm · 0.43mm/px · 1 of 28 slices shown (1 of 4)]
[im 1/28]
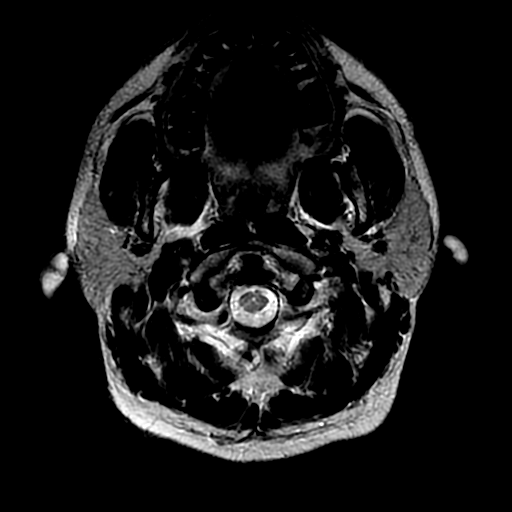

[Series 8: FLAIR · axial · 5.0mm · 0.43mm/px · 1 of 28 slices shown]
[im 1/28]
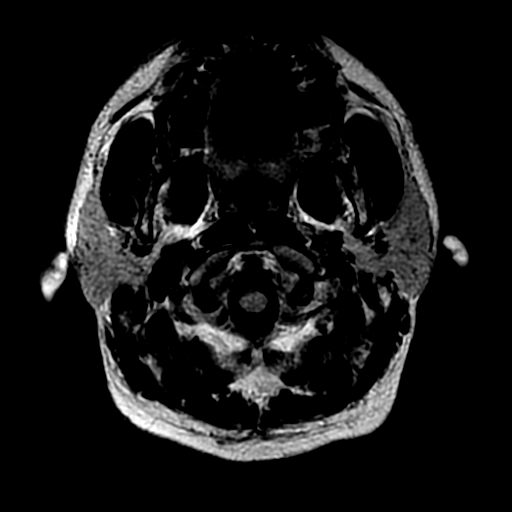

[Series 9: ax mpgr · axial · 5.0mm · 0.43mm/px · 1 of 25 slices shown]
[im 1/25]
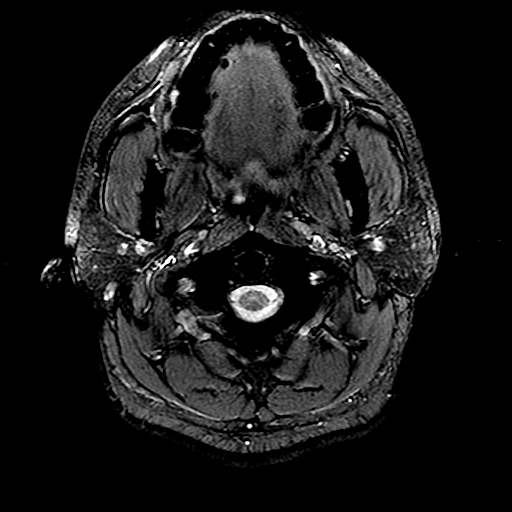

[Series 10: ax fspgr irp · axial · 3.0mm · 0.47mm/px · z∈[-50,+107]mm · 2 of 56 slices shown]
[im 1/56]
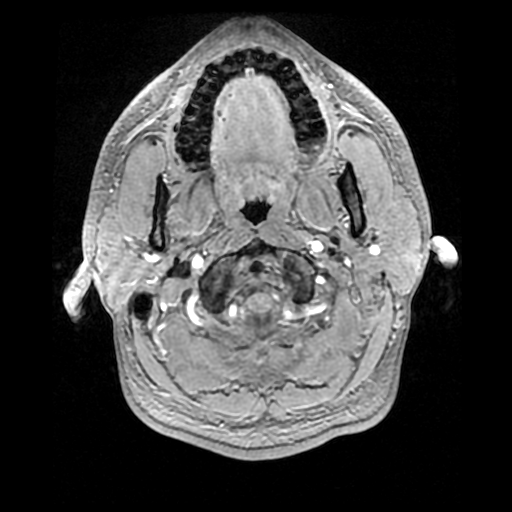
[im 56/56]
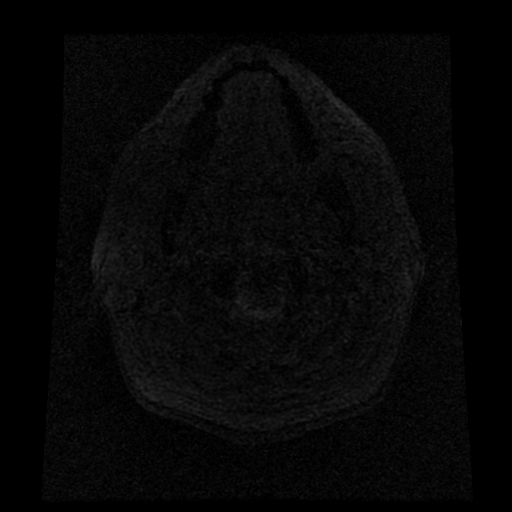

[Series 11: T2 · coronal · 5.0mm · 0.45mm/px · 1 of 29 slices shown (2 of 4)]
[im 1/29]
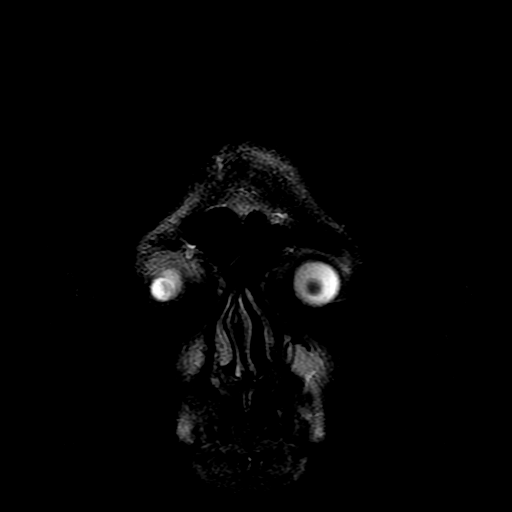

[Series 14: T1 · sagittal · 3.0mm · 0.82mm/px · 1 of 12 slices shown (2 of 3)]
[im 1/12]
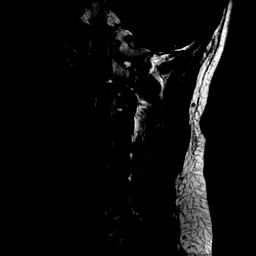

[Series 16: T2 · sagittal · 3.0mm · 0.41mm/px · 1 of 12 slices shown (3 of 4)]
[im 1/12]
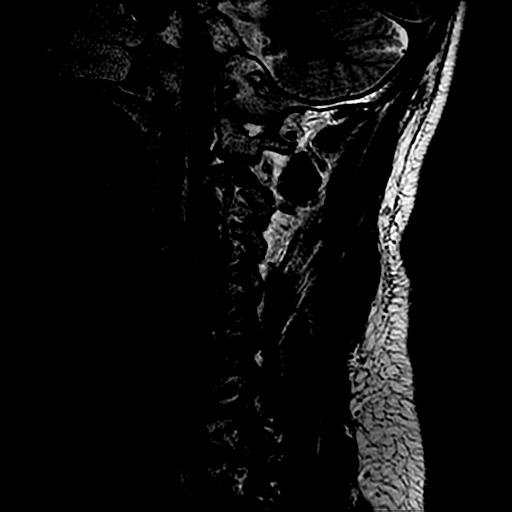

[Series 18: T2 · axial · 3.1mm · 0.35mm/px · 1 of 34 slices shown (4 of 4)]
[im 1/34]
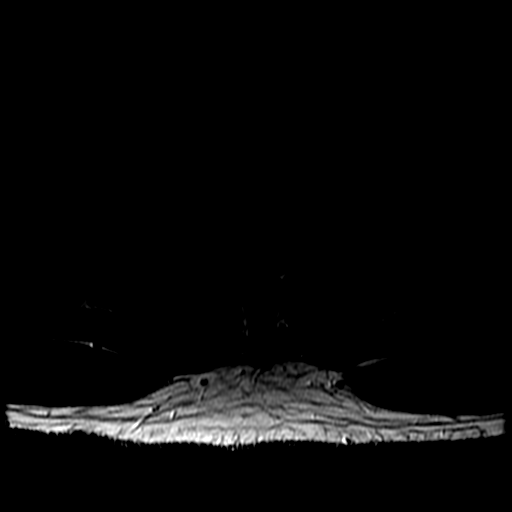

[Series 19: T1 · axial · non-contrast · 3.1mm · 0.35mm/px · 1 of 34 slices shown (3 of 3)]
[im 1/34]
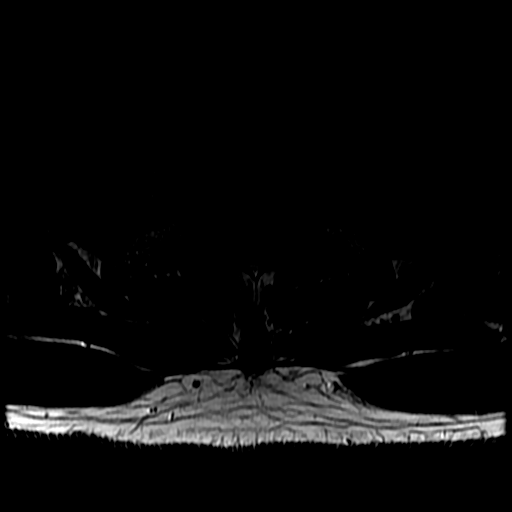

[Series 20: T1 fat-sat post-contrast · sagittal · 3.0mm · 0.82mm/px · 1 of 12 slices shown]
[im 1/12]
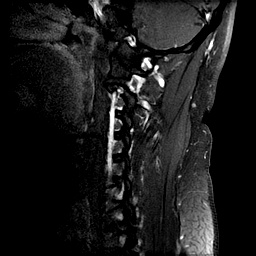

[Series 21: T1 post-contrast · axial · 3.1mm · 0.35mm/px · 1 of 34 slices shown (1 of 3)]
[im 1/34]
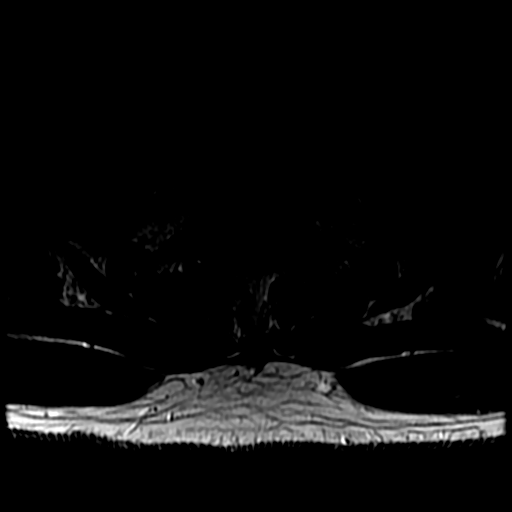

[Series 23: T1 post-contrast · coronal · 5.0mm · 0.45mm/px · 1 of 29 slices shown (2 of 3)]
[im 1/29]
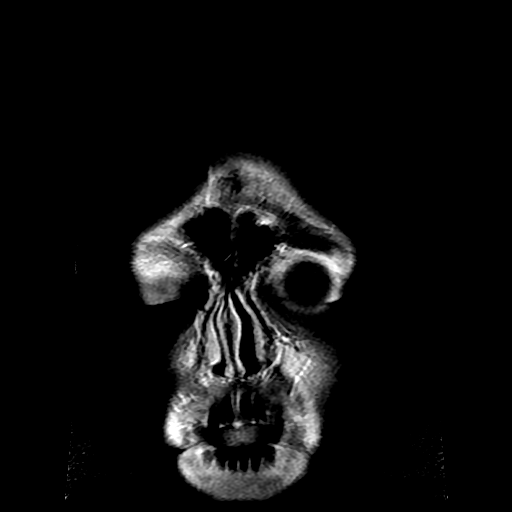

[Series 24: T1 post-contrast · sagittal · 5.0mm · 0.47mm/px · 1 of 24 slices shown (3 of 3)]
[im 1/24]
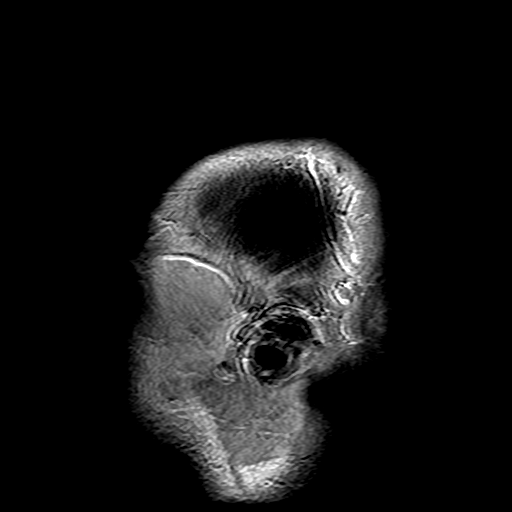

[25 of 48 positions shown; findings below may reference images not displayed]

FINDINGS: MRI HEAD FINDINGS

Brain: Examination technically limited by motion artifact.

Cerebral volume within normal limits for patient age. No focal
parenchymal signal abnormality identified. No abnormal foci of
restricted diffusion to suggest acute or subacute ischemia.
Gray-white matter differentiation well maintained. No
encephalomalacia to suggest chronic infarction. No foci of
susceptibility artifact to suggest acute or chronic intracranial
hemorrhage.

Mass lesion, midline shift or mass effect. No hydrocephalus. No
extra-axial fluid collection. Major dural sinuses are grossly
patent.

Pituitary gland and suprasellar region are normal. Midline
structures intact and normal.

No abnormal enhancement.

Vascular: Major intracranial vascular flow voids well maintained and
normal in appearance.

Skull and upper cervical spine: Craniocervical junction normal.
Visualized upper cervical spine within normal limits. Bone marrow
signal intensity normal. No scalp soft tissue abnormality.

Sinuses/Orbits: Globes and orbital soft tissues within normal
limits. Mild scattered mucosal thickening throughout the paranasal
sinuses. No air-fluid levels to suggest acute sinusitis. Trace right
mastoid effusion, of doubtful significance. No mastoid effusion.
Inner ear structures normal.

Other: None.

MRI CERVICAL SPINE FINDINGS

Alignment: Examination technically limited by motion artifact.

Straightening of the normal cervical lordosis.  No listhesis.

Vertebrae: Vertebral body heights well maintained without evidence
for acute or chronic fracture. Bone marrow signal intensity within
normal limits. No discrete or worrisome osseous lesions. No abnormal
marrow edema or enhancement.

Cord: Signal intensity within the cervical spinal cord is normal. No
focal cord signal abnormality. No abnormal enhancement.

Posterior Fossa, vertebral arteries, paraspinal tissues:
Craniocervical junction normal. Paraspinous and prevertebral soft
tissues within normal limits. Normal intravascular flow voids
present within the vertebral arteries bilaterally.

Disc levels: No significant disc pathology seen within the cervical
spine. No disc bulge or disc protrusion. No significant facet
degeneration. No canal or neural foraminal stenosis.
IMPRESSION: MRI HEAD IMPRESSION:

Normal brain MRI.  No acute intracranial abnormality identified.

MRI CERVICAL SPINE IMPRESSION:

Normal MRI of the cervical spine. Normal MRI appearance of the
cervical spinal cord. No significant disc pathology or stenosis.

## 2020-09-03 ENCOUNTER — Encounter: Payer: No Typology Code available for payment source | Admitting: Family Medicine

## 2024-02-22 ENCOUNTER — Emergency Department (HOSPITAL_BASED_OUTPATIENT_CLINIC_OR_DEPARTMENT_OTHER)
Admission: EM | Admit: 2024-02-22 | Discharge: 2024-02-22 | Disposition: A | Discharge status: Home / Self Care | Payer: Self-pay | Attending: Emergency Medicine | Admitting: Emergency Medicine

## 2024-02-22 ENCOUNTER — Other Ambulatory Visit: Payer: Self-pay

## 2024-02-22 DIAGNOSIS — Z23 Encounter for immunization: Secondary | ICD-10-CM | POA: Insufficient documentation

## 2024-02-22 DIAGNOSIS — S0181XA Laceration without foreign body of other part of head, initial encounter: Secondary | ICD-10-CM | POA: Insufficient documentation

## 2024-02-22 DIAGNOSIS — W228XXA Striking against or struck by other objects, initial encounter: Secondary | ICD-10-CM | POA: Insufficient documentation

## 2024-02-22 MED ORDER — TETANUS-DIPHTH-ACELL PERTUSSIS 5-2.5-18.5 LF-MCG/0.5 IM SUSY
0.5000 mL | PREFILLED_SYRINGE | Freq: Once | INTRAMUSCULAR | Status: AC
  Administered 2024-02-22: 0.5 mL via INTRAMUSCULAR
  Filled 2024-02-22: qty 0.5

## 2024-02-22 NOTE — Discharge Instructions (Signed)
 Pleasure taking care of you today.  You were evaluated in the emergency room for a laceration below your right eye.  Fortunately this did not involve your eye or the margin of the eyelid.  We were able to repair this with glue.  Keep the area clean and dry, do not soak it or apply any lotions, creams or ointments.  The glue will eventually peel off.  Follow-up closely with PCP for wound check, come back to the ER if you have redness, drainage, swelling, fevers which are signs of uncommon complication such as infection or a small piece of foreign material stuck in the wound.

## 2024-02-22 NOTE — ED Provider Notes (Signed)
 [facility] AT [facility] Provider Note   CSN: [id number] Arrival date & time: 02/22/24  1314     Patient presents with: Laceration   Clifford Bradford is a 43 y.o. male.  He denies any significant past medical history.  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  Denies any trauma to the eye itself, blurry vision but noticed bleeding from under the eye from the lower lid.  He is unsure of the date of his last tetanus but states it has probably been greater than 10-15 years.    Laceration      Prior to Admission medications   Not on File    Allergies: Soy allergy (obsolete)    Review of Systems  Updated Vital Signs BP 135/86 (BP Location: Right Arm)   Pulse 83   Temp 98.2 F (36.8 C)   Resp 20   SpO2 99%   Physical Exam Vitals and nursing note reviewed.  Constitutional:      General: He is not in acute distress.    Appearance: He is well-developed.  HENT:     Head: Normocephalic and atraumatic.  Eyes:     Extraocular Movements: Extraocular movements intact.     Conjunctiva/sclera: Conjunctivae normal.     Pupils: Pupils are equal, round, and reactive to light.     Comments: No subconjunctival hemorrhage, no hyphema.  Minimal right infraorbital swelling.  No blurry or double vision extraocular movements  Cardiovascular:     Rate and Rhythm: Normal rate and regular rhythm.     Heart sounds: No murmur heard. Pulmonary:     Effort: Pulmonary effort is normal. No respiratory distress.     Breath sounds: Normal breath sounds.  Abdominal:     Palpations: Abdomen is soft.     Tenderness: There is no abdominal tenderness.  Musculoskeletal:        General: No swelling.     Cervical back: Neck supple.  Skin:    General: Skin is warm and dry.     Capillary Refill: Capillary refill takes less than 2 seconds.     Comments: Abrasion with  superficial laceration to right lower lid.  It does not involve the lid margin.   Neurological:     General: No focal deficit present.     Mental Status: He is alert and oriented to person, place, and time.  Psychiatric:        Mood and Affect: Mood normal.     (all labs ordered are listed, but only abnormal results are displayed) Labs Reviewed - No data to display  EKG: None  Radiology: No results found.   .Laceration Repair  Date/Time: 02/22/2024 3:35 PM  Performed by: Suellen Sherran LABOR, PA-C Authorized by: Suellen Sherran LABOR, PA-C   Consent:    Consent obtained:  Verbal   Consent given by:  Patient   Risks discussed:  Infection, pain, retained foreign body, poor cosmetic result and poor wound healing Universal protocol:    Patient identity confirmed:  Verbally with patient Anesthesia:    Anesthesia method:  None Laceration details:    Location:  Face   Face location:  R lower eyelid   Extent:  Superficial   Length (cm):  1 Pre-procedure details:    Preparation:  Patient was prepped and draped in usual sterile fashion Exploration:    Hemostasis achieved with:  Direct pressure  Wound exploration: entire depth of wound visualized     Wound extent: no foreign body and no signs of injury   Treatment:    Area cleansed with:  Saline   Amount of cleaning:  Standard   Irrigation solution:  Sterile saline   Irrigation method:  Syringe   Debridement:  None Skin repair:    Repair method:  Tissue adhesive Approximation:    Approximation:  Close Repair type:    Repair type:  Simple Post-procedure details:    Dressing:  Open (no dressing)   Procedure completion:  Tolerated well, no immediate complications    Medications Ordered in the ED  Tdap (BOOSTRIX ) injection 0.5 mL (has no administration in time range)                                    Medical Decision Making Differential diagnosis includes but limited to laceration, abrasion, contusion, fracture, corneal  abrasion, other  ED course: Patient presents with injury to the right lower lid.  He has a very small laceration with some superficial abrasions surrounding this.  Plan for copious irrigation and repair with Dermabond.  There is no involvement of the lid margin, no injury to the eye.  No vision changes, no signs of extraocular muscle entrapment.  Do not feel patient needs any further imaging or workup but will provide wound care and instruct him to follow-up closely for recheck with PCP.  Tetanus updated today.  Patient agreeable with plan of care and discharge.  Discussed he would need to return for any redness, swelling, drainage which we signs of possible infection or retained foreign body though there are no signs of foreign body on exam today and this is not high risk.  Patient was struck with a solid piece of metal and I do not have suspicion for foreign body.  Risk Prescription drug management.        Final diagnoses:  Laceration of face without complication, initial encounter    ED Discharge Orders     None          Suellen Sherran DELENA DEVONNA 02/22/24 1537    Jerrol Agent, MD 02/22/24 [phone]

## 2024-02-22 NOTE — ED Triage Notes (Addendum)
 Patient states was trying to move a piece of metal and it swung back and hit under his eye. Laceration under right eye. No issues with vision

## 2024-02-22 NOTE — ED Notes (Signed)
 Pt d/c instructions, medications, and follow-up care reviewed with pt. Pt verbalized understanding and had no further questions at time of d/c. Pt CA&Ox4, ambulatory, and in NAD at time of d/c
